# Patient Record
Sex: Male | Born: 1985 | Hispanic: No | Marital: Married | Smoking: Never smoker
Health system: Southern US, Community
[De-identification: ages and names within clinical notes are randomized; demographics above are authoritative.]

## PROBLEM LIST (undated history)

## (undated) DIAGNOSIS — Z8619 Personal history of other infectious and parasitic diseases: Secondary | ICD-10-CM

## (undated) HISTORY — PX: FINGER FRACTURE SURGERY: SHX638

## (undated) HISTORY — DX: Personal history of other infectious and parasitic diseases: Z86.19

---

## 2010-04-10 ENCOUNTER — Inpatient Hospital Stay (HOSPITAL_COMMUNITY): Admission: EM | Admit: 2010-04-10 | Discharge: 2010-04-15 | Payer: Self-pay | Source: Home / Self Care

## 2010-06-18 ENCOUNTER — Ambulatory Visit: Payer: Commercial Managed Care - PPO | Attending: Orthopedic Surgery | Admitting: Physical Therapy

## 2010-06-18 DIAGNOSIS — M25619 Stiffness of unspecified shoulder, not elsewhere classified: Secondary | ICD-10-CM | POA: Insufficient documentation

## 2010-06-18 DIAGNOSIS — IMO0001 Reserved for inherently not codable concepts without codable children: Secondary | ICD-10-CM | POA: Insufficient documentation

## 2010-06-18 DIAGNOSIS — M25519 Pain in unspecified shoulder: Secondary | ICD-10-CM | POA: Insufficient documentation

## 2010-06-27 ENCOUNTER — Ambulatory Visit: Payer: Commercial Managed Care - PPO | Attending: Orthopedic Surgery | Admitting: Physical Therapy

## 2010-06-27 DIAGNOSIS — IMO0001 Reserved for inherently not codable concepts without codable children: Secondary | ICD-10-CM | POA: Insufficient documentation

## 2010-06-27 DIAGNOSIS — M25519 Pain in unspecified shoulder: Secondary | ICD-10-CM | POA: Insufficient documentation

## 2010-06-27 DIAGNOSIS — M25619 Stiffness of unspecified shoulder, not elsewhere classified: Secondary | ICD-10-CM | POA: Insufficient documentation

## 2010-06-30 LAB — DIFFERENTIAL
Eosinophils Absolute: 0 10*3/uL (ref 0.0–0.7)
Eosinophils Relative: 0 % (ref 0–5)
Lymphocytes Relative: 9 % — ABNORMAL LOW (ref 12–46)
Lymphs Abs: 2.9 10*3/uL (ref 0.7–4.0)
Monocytes Absolute: 1.9 10*3/uL — ABNORMAL HIGH (ref 0.1–1.0)
Neutro Abs: 27.3 10*3/uL — ABNORMAL HIGH (ref 1.7–7.7)

## 2010-06-30 LAB — CBC
HCT: 41 % (ref 39.0–52.0)
Hemoglobin: 14.3 g/dL (ref 13.0–17.0)
MCH: 29.6 pg (ref 26.0–34.0)
MCHC: 33.1 g/dL (ref 30.0–36.0)
MCV: 89.6 fL (ref 78.0–100.0)
Platelets: 202 10*3/uL (ref 150–400)
Platelets: 228 10*3/uL (ref 150–400)
RBC: 4.54 MIL/uL (ref 4.22–5.81)
RBC: 4.76 MIL/uL (ref 4.22–5.81)
RDW: 13.1 % (ref 11.5–15.5)
RDW: 13.4 % (ref 11.5–15.5)
WBC: 20.6 10*3/uL — ABNORMAL HIGH (ref 4.0–10.5)
WBC: 32.1 10*3/uL — ABNORMAL HIGH (ref 4.0–10.5)

## 2010-06-30 LAB — MRSA PCR SCREENING: MRSA by PCR: NEGATIVE

## 2010-06-30 LAB — LIPASE, BLOOD: Lipase: 17 U/L (ref 11–59)

## 2010-06-30 LAB — BASIC METABOLIC PANEL
BUN: 7 mg/dL (ref 6–23)
CO2: 23 mEq/L (ref 19–32)
Calcium: 8.6 mg/dL (ref 8.4–10.5)
Chloride: 110 mEq/L (ref 96–112)
Creatinine, Ser: 1.09 mg/dL (ref 0.4–1.5)
GFR calc Af Amer: >60 mL/min (ref >60)
GFR calc non Af Amer: >60 mL/min (ref >60)
Glucose, Bld: 100 mg/dL — ABNORMAL HIGH (ref 70–99)
Potassium: 4.3 mEq/L (ref 3.5–5.1)

## 2010-06-30 LAB — URINALYSIS, ROUTINE W REFLEX MICROSCOPIC
Glucose, UA: NEGATIVE mg/dL
Ketones, ur: NEGATIVE mg/dL
Protein, ur: NEGATIVE mg/dL
Urobilinogen, UA: 0.2 mg/dL (ref 0.0–1.0)

## 2010-06-30 LAB — COMPREHENSIVE METABOLIC PANEL
Albumin: 3 g/dL — ABNORMAL LOW (ref 3.5–5.2)
CO2: 24 mEq/L (ref 19–32)
Chloride: 106 mEq/L (ref 96–112)
GFR calc non Af Amer: >60 mL/min (ref >60)
Glucose, Bld: 120 mg/dL — ABNORMAL HIGH (ref 70–99)
Potassium: 4.3 mEq/L (ref 3.5–5.1)
Sodium: 137 mEq/L (ref 135–145)
Total Bilirubin: 1.1 mg/dL (ref 0.3–1.2)

## 2010-06-30 LAB — AMYLASE: Amylase: 37 U/L (ref 0–105)
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
## 2016-03-10 DIAGNOSIS — H5213 Myopia, bilateral: Secondary | ICD-10-CM | POA: Diagnosis not present

## 2016-03-10 DIAGNOSIS — H52223 Regular astigmatism, bilateral: Secondary | ICD-10-CM | POA: Diagnosis not present

## 2017-09-21 ENCOUNTER — Encounter: Payer: Self-pay | Admitting: Physician Assistant

## 2017-09-21 ENCOUNTER — Ambulatory Visit: Payer: 59 | Admitting: Physician Assistant

## 2017-09-21 ENCOUNTER — Other Ambulatory Visit: Payer: Self-pay

## 2017-09-21 VITALS — BP 102/86 | HR 84 | Temp 98.2°F | Ht 74.8 in | Wt 227.6 lb

## 2017-09-21 DIAGNOSIS — Z13220 Encounter for screening for lipoid disorders: Secondary | ICD-10-CM | POA: Diagnosis not present

## 2017-09-21 DIAGNOSIS — Z1329 Encounter for screening for other suspected endocrine disorder: Secondary | ICD-10-CM | POA: Diagnosis not present

## 2017-09-21 DIAGNOSIS — Z7689 Persons encountering health services in other specified circumstances: Secondary | ICD-10-CM | POA: Diagnosis not present

## 2017-09-21 DIAGNOSIS — Z1389 Encounter for screening for other disorder: Secondary | ICD-10-CM

## 2017-09-21 DIAGNOSIS — Z131 Encounter for screening for diabetes mellitus: Secondary | ICD-10-CM

## 2017-09-21 DIAGNOSIS — Z130 Encounter for screening for diseases of the blood and blood-forming organs and certain disorders involving the immune mechanism: Secondary | ICD-10-CM

## 2017-09-21 DIAGNOSIS — Z13228 Encounter for screening for other metabolic disorders: Secondary | ICD-10-CM | POA: Diagnosis not present

## 2017-09-21 DIAGNOSIS — Z0000 Encounter for general adult medical examination without abnormal findings: Secondary | ICD-10-CM

## 2017-09-21 LAB — POCT URINALYSIS DIP (MANUAL ENTRY)
BILIRUBIN UA: NEGATIVE
BILIRUBIN UA: NEGATIVE mg/dL
Blood, UA: NEGATIVE
Glucose, UA: NEGATIVE mg/dL
Leukocytes, UA: NEGATIVE
Nitrite, UA: NEGATIVE
Protein Ur, POC: NEGATIVE mg/dL
SPEC GRAV UA: 1.015 (ref 1.010–1.025)
Urobilinogen, UA: 0.2 E.U./dL
pH, UA: 7.5 (ref 5.0–8.0)

## 2017-09-21 NOTE — Progress Notes (Signed)
Gill Delrossi  MRN: [id number] DOB: 16-Apr-1986  Subjective:  Pt is a 32 y.o. male who presents for annual physical exam. Pt did eat some fruit this morning a few hours ago.   Diet: Eats home cooked meals mostly, will eat out once a week. Will typically make crock pot meals. Wife is vegetarian so he ends up eating a lot of veggie meals. Will eat lean meats, fish, grilled chicken. No fast food. No soda. Drinks a lot of coffee and water. No multivitamin. Exercise: Goes in waves. Avg is 2-3 x a week. Will run at [facility]. No weights.  BM: Daily Sleep: 7-8 hours a night  Last dental exam: 3 years ago, brushes twice daily.  Last vision exam: Yearly, wears Rx contacts  Vaccinations      Tetanus: Thinks he had it within the past 10 years.         There are no active problems to display for this patient.   No current outpatient medications on file prior to visit.   No current facility-administered medications on file prior to visit.     Not on File  Social History   Socioeconomic History  . Marital status: Married    Spouse name: Not on file  . Number of children: Not on file  . Years of education: Not on file  . Highest education level: Not on file  Occupational History  . Not on file  Social Needs  . Financial resource strain: Not on file  . Food insecurity:    Worry: Not on file    Inability: Not on file  . Transportation needs:    Medical: Not on file    Non-medical: Not on file  Tobacco Use  . Smoking status: Never Smoker  . Smokeless tobacco: Never Used  Substance and Sexual Activity  . Alcohol use: Yes    Frequency: Never  . Drug use: Never  . Sexual activity: Yes  Lifestyle  . Physical activity:    Days per week: Not on file    Minutes per session: Not on file  . Stress: Not on file  Relationships  . Social connections:    Talks on phone: Not on file    Gets together: Not on file    Attends religious service: Not on file    Active member of club  or organization: Not on file    Attends meetings of clubs or organizations: Not on file    Relationship status: Not on file  Other Topics Concern  . Not on file  Social History Narrative  . Not on file    History reviewed. No pertinent surgical history.  Family History  Problem Relation Age of Onset  . Cancer Mother   . Hypertension Mother   . Hyperlipidemia Maternal Grandmother   . Hypertension Maternal Grandmother   . Hypertension Maternal Grandfather     Review of Systems  Constitutional: Negative for activity change, appetite change, chills, diaphoresis, fatigue, fever and unexpected weight change.  HENT: Negative for congestion, dental problem, drooling, ear discharge, ear pain, facial swelling, hearing loss, mouth sores, nosebleeds, postnasal drip, rhinorrhea, sinus pressure, sinus pain, sneezing, sore throat, tinnitus, trouble swallowing and voice change.   Eyes: Negative for photophobia, pain, discharge, redness, itching and visual disturbance.  Respiratory: Negative for apnea, cough, choking, chest tightness, shortness of breath, wheezing and stridor.   Cardiovascular: Negative for chest pain, palpitations and leg swelling.  Gastrointestinal: Negative for abdominal distention, abdominal pain, anal bleeding, blood  in stool, constipation, diarrhea, nausea, rectal pain and vomiting.  Endocrine: Negative for cold intolerance, heat intolerance, polydipsia, polyphagia and polyuria.  Genitourinary: Negative for decreased urine volume, difficulty urinating, discharge, dysuria, enuresis, flank pain, frequency, genital sores, hematuria, penile pain, penile swelling, scrotal swelling, testicular pain and urgency.  Musculoskeletal: Negative for arthralgias, back pain, gait problem, joint swelling, myalgias, neck pain and neck stiffness.  Skin: Negative for color change, pallor, rash and wound.  Allergic/Immunologic: Negative for environmental allergies, food allergies and  immunocompromised state.  Neurological: Negative for dizziness, tremors, seizures, syncope, facial asymmetry, speech difficulty, weakness, light-headedness, numbness and headaches.  Hematological: Negative for adenopathy. Does not bruise/bleed easily.  Psychiatric/Behavioral: Negative for agitation, behavioral problems, confusion, decreased concentration, dysphoric mood, hallucinations, self-injury, sleep disturbance and suicidal ideas. The patient is not nervous/anxious and is not hyperactive.     Objective:  BP 102/86 (BP Location: Left Arm, Patient Position: Sitting, Cuff Size: Large)   Pulse 84   Temp 98.2 F (36.8 C) (Oral)   Ht 6' 2.8" (1.9 m)   Wt 227 lb 9.6 oz (103.2 kg)   SpO2 97%   BMI 28.60 kg/m   Physical Exam  Constitutional: He is oriented to person, place, and time. He appears well-developed and well-nourished. No distress.  HENT:  Head: Normocephalic and atraumatic.  Right Ear: Hearing, tympanic membrane, external ear and ear canal normal.  Left Ear: Hearing, tympanic membrane, external ear and ear canal normal.  Nose: Nose normal.  Mouth/Throat: Uvula is midline, oropharynx is clear and moist and mucous membranes are normal. No oropharyngeal exudate.  Eyes: Pupils are equal, round, and reactive to light. Conjunctivae and EOM are normal.  Neck: Trachea normal and normal range of motion.  Cardiovascular: Normal rate, regular rhythm, normal heart sounds and intact distal pulses.  Pulmonary/Chest: Effort normal and breath sounds normal.  Abdominal: Soft. Normal appearance and bowel sounds are normal.  Musculoskeletal: Normal range of motion.  Lymphadenopathy:       Head (right side): No submental, no submandibular, no tonsillar, no preauricular, no posterior auricular and no occipital adenopathy present.       Head (left side): No submental, no submandibular, no tonsillar, no preauricular, no posterior auricular and no occipital adenopathy present.    He has no cervical  adenopathy.       Right: No supraclavicular adenopathy present.       Left: No supraclavicular adenopathy present.  Neurological: He is alert and oriented to person, place, and time. He has normal strength and normal reflexes.  Skin: Skin is warm and dry.  Vitals reviewed.  No exam data present  Assessment and Plan :  Discussed healthy lifestyle, diet, exercise, preventative care, vaccinations, and addressed patient's concerns. Plan for follow up in one year. Otherwise, plan for specific conditions below.  1. Annual physical exam Await lab results. Rec he bring immunization record to next visit.   2. Establishing care with new doctor  3. Screening, anemia, deficiency, iron - CBC with Differential/Platelet  4. Screening for metabolic disorder - [id number]  5. Screening cholesterol level - Lipid panel  6. Screening for thyroid disorder - TSH  7. Screening for diabetes mellitus - Hemoglobin A1c  8. Screening for hematuria or proteinuria - POCT urinalysis dipstick  Tenna Delaine, PA-C  [facility] 09/21/2017 10:30 AM

## 2017-09-21 NOTE — Patient Instructions (Addendum)
Health Maintenance, Male A healthy lifestyle and preventive care is important for your health and wellness. Ask your health care provider about what schedule of regular examinations is right for you. What should I know about weight and diet? Eat a Healthy Diet  Eat plenty of vegetables, fruits, whole grains, low-fat dairy products, and lean protein.  Do not eat a lot of foods high in solid fats, added sugars, or salt.  Maintain a Healthy Weight Regular exercise can help you achieve or maintain a healthy weight. You should:  Do at least 150 minutes of exercise each week. The exercise should increase your heart rate and make you sweat (moderate-intensity exercise).  Do strength-training exercises at least twice a week.  Watch Your Levels of Cholesterol and Blood Lipids  Have your blood tested for lipids and cholesterol every 5 years starting at 35 years of age. If you are at high risk for heart disease, you should start having your blood tested when you are 34 years old. You may need to have your cholesterol levels checked more often if: ? Your lipid or cholesterol levels are high. ? You are older than 34 years of age. ? You are at high risk for heart disease.  What should I know about cancer screening? Many types of cancers can be detected early and may often be prevented. Lung Cancer  You should be screened every year for lung cancer if: ? You are a current smoker who has smoked for at least 30 years. ? You are a former smoker who has quit within the past 15 years.  Talk to your health care provider about your screening options, when you should start screening, and how often you should be screened.  Colorectal Cancer  Routine colorectal cancer screening usually begins at 34 years of age and should be repeated every 5-10 years until you are 34 years old. You may need to be screened more often if early forms of precancerous polyps or small growths are found. Your health care provider  may recommend screening at an earlier age if you have risk factors for colon cancer.  Your health care provider may recommend using home test kits to check for hidden blood in the stool.  A small camera at the end of a tube can be used to examine your colon (sigmoidoscopy or colonoscopy). This checks for the earliest forms of colorectal cancer.  Prostate and Testicular Cancer  Depending on your age and overall health, your health care provider may do certain tests to screen for prostate and testicular cancer.  Talk to your health care provider about any symptoms or concerns you have about testicular or prostate cancer.  Skin Cancer  Check your skin from head to toe regularly.  Tell your health care provider about any new moles or changes in moles, especially if: ? There is a change in a mole's size, shape, or color. ? You have a mole that is larger than a pencil eraser.  Always use sunscreen. Apply sunscreen liberally and repeat throughout the day.  Protect yourself by wearing long sleeves, pants, a wide-brimmed hat, and sunglasses when outside.  What should I know about heart disease, diabetes, and high blood pressure?  If you are 18-39 years of age, have your blood pressure checked every 3-5 years. If you are 40 years of age or older, have your blood pressure checked every year. You should have your blood pressure measured twice-once when you are at a hospital or clinic, and once when   you are not at a hospital or clinic. Record the average of the two measurements. To check your blood pressure when you are not at a hospital or clinic, you can use: ? An automated blood pressure machine at a pharmacy. ? A home blood pressure monitor.  Talk to your health care provider about your target blood pressure.  If you are between 45-79 years old, ask your health care provider if you should take aspirin to prevent heart disease.  Have regular diabetes screenings by checking your fasting blood  sugar level. ? If you are at a normal weight and have a low risk for diabetes, have this test once every three years after the age of 45. ? If you are overweight and have a high risk for diabetes, consider being tested at a younger age or more often.  A one-time screening for abdominal aortic aneurysm (AAA) by ultrasound is recommended for men aged 65-75 years who are current or former smokers. What should I know about preventing infection? Hepatitis B If you have a higher risk for hepatitis B, you should be screened for this virus. Talk with your health care provider to find out if you are at risk for hepatitis B infection. Hepatitis C Blood testing is recommended for:  Everyone born from 1945 through 1965.  Anyone with known risk factors for hepatitis C.  Sexually Transmitted Diseases (STDs)  You should be screened each year for STDs including gonorrhea and chlamydia if: ? You are sexually active and are younger than 34 years of age. ? You are older than 34 years of age and your health care provider tells you that you are at risk for this type of infection. ? Your sexual activity has changed since you were last screened and you are at an increased risk for chlamydia or gonorrhea. Ask your health care provider if you are at risk.  Talk with your health care provider about whether you are at high risk of being infected with HIV. Your health care provider may recommend a prescription medicine to help prevent HIV infection.  What else can I do?  Schedule regular health, dental, and eye exams.  Stay current with your vaccines (immunizations).  Do not use any tobacco products, such as cigarettes, chewing tobacco, and e-cigarettes. If you need help quitting, ask your health care provider.  Limit alcohol intake to no more than 2 drinks per day. One drink equals 12 ounces of beer, 5 ounces of wine, or 1 ounces of hard liquor.  Do not use street drugs.  Do not share needles.  Ask your  health care provider for help if you need support or information about quitting drugs.  Tell your health care provider if you often feel depressed.  Tell your health care provider if you have ever been abused or do not feel safe at home. This information is not intended to replace advice given to you by your health care provider. Make sure you discuss any questions you have with your health care provider. Document Released: 10/03/2007 Document Revised: 12/04/2015 Document Reviewed: 01/08/2015 Elsevier Interactive Patient Education  2018 [employer]     IF you received an x-ray today, you will receive an invoice from [facility]. Please contact [facility] at [phone] with questions or concerns regarding your invoice.   IF you received labwork today, you will receive an invoice from [facility]. Please contact [facility] at [phone] with questions or concerns regarding your invoice.   Our billing staff will not be   able to assist you with questions regarding bills from these companies.  You will be contacted with the lab results as soon as they are available. The fastest way to get your results is to activate your My Chart account. Instructions are located on the last page of this paperwork. If you have not heard from us regarding the results in 2 weeks, please contact this office.       

## 2017-09-22 ENCOUNTER — Encounter: Payer: Self-pay | Admitting: *Deleted

## 2017-09-22 LAB — CBC WITH DIFFERENTIAL/PLATELET
BASOS: 1 %
Basophils Absolute: 0.1 10*3/uL (ref 0.0–0.2)
EOS (ABSOLUTE): 0.1 10*3/uL (ref 0.0–0.4)
EOS: 2 %
HEMATOCRIT: 45.1 % (ref 37.5–51.0)
HEMOGLOBIN: 15.6 g/dL (ref 13.0–17.7)
IMMATURE GRANS (ABS): 0 10*3/uL (ref 0.0–0.1)
Immature Granulocytes: 0 %
LYMPHS ABS: 2.3 10*3/uL (ref 0.7–3.1)
Lymphs: 39 %
MCH: 29.7 pg (ref 26.6–33.0)
MCHC: 34.6 g/dL (ref 31.5–35.7)
MCV: 86 fL (ref 79–97)
MONOCYTES: 11 %
Monocytes Absolute: 0.6 10*3/uL (ref 0.1–0.9)
NEUTROS ABS: 2.8 10*3/uL (ref 1.4–7.0)
Neutrophils: 47 %
Platelets: 299 10*3/uL (ref 150–450)
RBC: 5.25 x10E6/uL (ref 4.14–5.80)
RDW: 13.5 % (ref 12.3–15.4)
WBC: 5.9 10*3/uL (ref 3.4–10.8)

## 2017-09-22 LAB — CMP14+EGFR
A/G RATIO: 1.7 (ref 1.2–2.2)
ALK PHOS: 67 IU/L (ref 39–117)
ALT: 49 IU/L — ABNORMAL HIGH (ref 0–44)
AST: 35 IU/L (ref 0–40)
Albumin: 4.5 g/dL (ref 3.5–5.5)
BILIRUBIN TOTAL: 1.1 mg/dL (ref 0.0–1.2)
BUN / CREAT RATIO: 12 (ref 9–20)
BUN: 13 mg/dL (ref 6–20)
CHLORIDE: 99 mmol/L (ref 96–106)
CO2: 25 mmol/L (ref 20–29)
Calcium: 9.8 mg/dL (ref 8.7–10.2)
Creatinine, Ser: 1.1 mg/dL (ref 0.76–1.27)
GFR calc non Af Amer: 87 mL/min/{1.73_m2} (ref >59)
GFR, EST AFRICAN AMERICAN: 101 mL/min/{1.73_m2} (ref >59)
GLOBULIN, TOTAL: 2.6 g/dL (ref 1.5–4.5)
Glucose: 91 mg/dL (ref 65–99)
Potassium: 5.2 mmol/L (ref 3.5–5.2)
SODIUM: 138 mmol/L (ref 134–144)
TOTAL PROTEIN: 7.1 g/dL (ref 6.0–8.5)

## 2017-09-22 LAB — LIPID PANEL
CHOLESTEROL TOTAL: 196 mg/dL (ref 100–199)
Chol/HDL Ratio: 2.8 ratio (ref 0.0–5.0)
HDL: 71 mg/dL (ref >39)
LDL Calculated: 109 mg/dL — ABNORMAL HIGH (ref 0–99)
Triglycerides: 82 mg/dL (ref 0–149)
VLDL Cholesterol Cal: 16 mg/dL (ref 5–40)

## 2017-09-22 LAB — TSH: TSH: 1.72 u[IU]/mL (ref 0.450–4.500)

## 2017-09-22 LAB — HEMOGLOBIN A1C
ESTIMATED AVERAGE GLUCOSE: 103 mg/dL
HEMOGLOBIN A1C: 5.2 % (ref 4.8–5.6)

## 2019-07-17 ENCOUNTER — Other Ambulatory Visit: Payer: Self-pay

## 2019-07-17 ENCOUNTER — Encounter: Payer: Self-pay | Admitting: Physician Assistant

## 2019-07-17 ENCOUNTER — Ambulatory Visit (INDEPENDENT_AMBULATORY_CARE_PROVIDER_SITE_OTHER): Payer: No Typology Code available for payment source | Admitting: Physician Assistant

## 2019-07-17 VITALS — BP 100/70 | HR 79 | Temp 99.1°F | Resp 16 | Ht 72.25 in | Wt 214.0 lb

## 2019-07-17 DIAGNOSIS — Z0000 Encounter for general adult medical examination without abnormal findings: Secondary | ICD-10-CM | POA: Diagnosis not present

## 2019-07-17 LAB — LIPID PANEL
Cholesterol: 172 mg/dL (ref 0–200)
HDL: 56.5 mg/dL (ref >39.00)
LDL Cholesterol: 97 mg/dL (ref 0–99)
NonHDL: 115.49
Total CHOL/HDL Ratio: 3
Triglycerides: 93 mg/dL (ref 0.0–149.0)
VLDL: 18.6 mg/dL (ref 0.0–40.0)

## 2019-07-17 LAB — CBC WITH DIFFERENTIAL/PLATELET
Basophils Absolute: 0.1 10*3/uL (ref 0.0–0.1)
Basophils Relative: 0.9 % (ref 0.0–3.0)
Eosinophils Absolute: 0.2 10*3/uL (ref 0.0–0.7)
Eosinophils Relative: 3 % (ref 0.0–5.0)
HCT: 44.8 % (ref 39.0–52.0)
Hemoglobin: 15.4 g/dL (ref 13.0–17.0)
Lymphocytes Relative: 35.2 % (ref 12.0–46.0)
Lymphs Abs: 2.1 10*3/uL (ref 0.7–4.0)
MCHC: 34.3 g/dL (ref 30.0–36.0)
MCV: 90.3 fl (ref 78.0–100.0)
Monocytes Absolute: 0.5 10*3/uL (ref 0.1–1.0)
Monocytes Relative: 8.3 % (ref 3.0–12.0)
Neutro Abs: 3.1 10*3/uL (ref 1.4–7.7)
Neutrophils Relative %: 52.6 % (ref 43.0–77.0)
Platelets: 282 10*3/uL (ref 150.0–400.0)
RBC: 4.96 Mil/uL (ref 4.22–5.81)
RDW: 13.9 % (ref 11.5–15.5)
WBC: 5.9 10*3/uL (ref 4.0–10.5)

## 2019-07-17 LAB — COMPREHENSIVE METABOLIC PANEL
ALT: 33 U/L (ref 0–53)
AST: 27 U/L (ref 0–37)
Albumin: 4.3 g/dL (ref 3.5–5.2)
Alkaline Phosphatase: 55 U/L (ref 39–117)
BUN: 14 mg/dL (ref 6–23)
CO2: 29 mEq/L (ref 19–32)
Calcium: 9.5 mg/dL (ref 8.4–10.5)
Chloride: 104 mEq/L (ref 96–112)
Creatinine, Ser: 0.93 mg/dL (ref 0.40–1.50)
GFR: 91.94 mL/min (ref >60.00)
Glucose, Bld: 85 mg/dL (ref 70–99)
Potassium: 4.8 mEq/L (ref 3.5–5.1)
Sodium: 139 mEq/L (ref 135–145)
Total Bilirubin: 1.3 mg/dL — ABNORMAL HIGH (ref 0.2–1.2)
Total Protein: 6.9 g/dL (ref 6.0–8.3)

## 2019-07-17 NOTE — Progress Notes (Signed)
Patient presents to clinic today to establish care and for annual exam.  Patient is fasting for labs.  Diet -- Mostly plant-based diet (wife is vegetarian).   Exercise -- Was running about 5-6 days per week at the park prior to [place]. Has still been keeping up with this but not as much.   Acute Concerns: Denies acute concerns today.   Health Maintenance: Immunizations -- Flu shot up-to-date. Tetanus UTD per patient. Will get records from employee health.   Past Medical History:  Diagnosis Date  . History of chickenpox     Past Surgical History:  Procedure Laterality Date  . FINGER FRACTURE SURGERY     Pinky    No current outpatient medications on file prior to visit.   No current facility-administered medications on file prior to visit.    Allergies  Allergen Reactions  . Tuberculin Tests Rash    To ppd skin test    Family History  Problem Relation Age of Onset  . Hypertension Mother   . Breast cancer Mother   . Healthy Father   . Healthy Sister   . Hyperlipidemia Maternal Grandmother   . Hypertension Maternal Grandmother   . Hypertension Maternal Grandfather     Social History   Socioeconomic History  . Marital status: Married    Spouse name: Caryl Pina   . Number of children: 0  . Years of education: Not on file  . Highest education level: Doctorate  Occupational History  . Occupation: Marine scientist: [employer]  Tobacco Use  . Smoking status: Never Smoker  . Smokeless tobacco: Never Used  Substance and Sexual Activity  . Alcohol use: Yes    Comment: socially 1-2 x per week  . Drug use: Never  . Sexual activity: Yes    Partners: Female    Birth control/protection: None    Comment: with monagamous partner  Other Topics Concern  . Not on file  Social History Narrative   Pt was born in [place]. Moved here in 1994. Currently lives with his wife. They have a cat. They are currently trying to have children.    Social Determinants  of Health   Financial Resource Strain:   . Difficulty of Paying Living Expenses:   Food Insecurity:   . Worried About Charity fundraiser in the Last Year:   . Arboriculturist in the Last Year:   Transportation Needs:   . Film/video editor (Medical):   Marland Kitchen Lack of Transportation (Non-Medical):   Physical Activity:   . Days of Exercise per Week:   . Minutes of Exercise per Session:   Stress:   . Feeling of Stress :   Social Connections:   . Frequency of Communication with Friends and Family:   . Frequency of Social Gatherings with Friends and Family:   . Attends Religious Services:   . Active Member of Clubs or Organizations:   . Attends Archivist Meetings:   Marland Kitchen Marital Status:   Intimate Partner Violence:   . Fear of Current or Ex-Partner:   . Emotionally Abused:   Marland Kitchen Physically Abused:   . Sexually Abused:    Review of Systems  Constitutional: Negative for fever and weight loss.  HENT: Negative for ear discharge, ear pain, hearing loss and tinnitus.   Eyes: Negative for blurred vision, double vision, photophobia and pain.  Respiratory: Negative for cough and shortness of breath.   Cardiovascular: Negative for chest pain  and palpitations.  Gastrointestinal: Negative for abdominal pain, blood in stool, constipation, diarrhea, heartburn, melena, nausea and vomiting.  Genitourinary: Negative for dysuria, flank pain, frequency, hematuria and urgency.  Musculoskeletal: Negative for falls.  Neurological: Negative for dizziness, loss of consciousness and headaches.  Endo/Heme/Allergies: Negative for environmental allergies.  Psychiatric/Behavioral: Negative for depression, hallucinations, substance abuse and suicidal ideas. The patient is not nervous/anxious and does not have insomnia.    BP 100/70   Pulse 79   Temp 99.1 F (37.3 C) (Temporal)   Resp 16   Ht 6' 0.25" (1.835 m)   Wt 214 lb (97.1 kg)   SpO2 98%   BMI 28.82 kg/m   Physical Exam Vitals reviewed.    Constitutional:      General: He is not in acute distress.    Appearance: He is well-developed. He is not diaphoretic.  HENT:     Head: Normocephalic and atraumatic.     Right Ear: Tympanic membrane, ear canal and external ear normal.     Left Ear: Tympanic membrane, ear canal and external ear normal.     Nose: Nose normal.     Mouth/Throat:     Pharynx: No posterior oropharyngeal erythema.  Eyes:     Conjunctiva/sclera: Conjunctivae normal.     Pupils: Pupils are equal, round, and reactive to light.  Neck:     Thyroid: No thyromegaly.  Cardiovascular:     Rate and Rhythm: Normal rate and regular rhythm.     Heart sounds: Normal heart sounds.  Pulmonary:     Effort: Pulmonary effort is normal. No respiratory distress.     Breath sounds: Normal breath sounds. No wheezing or rales.  Chest:     Chest wall: No tenderness.  Abdominal:     General: Bowel sounds are normal. There is no distension.     Palpations: Abdomen is soft. There is no mass.     Tenderness: There is no abdominal tenderness. There is no guarding or rebound.  Musculoskeletal:     Cervical back: Neck supple.  Lymphadenopathy:     Cervical: No cervical adenopathy.  Skin:    General: Skin is warm and dry.     Findings: No rash.  Neurological:     Mental Status: He is alert and oriented to person, place, and time.     Cranial Nerves: No cranial nerve deficit.    Assessment/Plan: Visit for preventive health examination Depression screen negative. Health Maintenance reviewed -- flu vaccine UTD. Will get records of Tetanus. Preventive schedule discussed and handout given in AVS. Will obtain fasting labs today.   This visit occurred during the SARS-CoV-2 public health emergency.  Safety protocols were in place, including screening questions prior to the visit, additional usage of staff PPE, and extensive cleaning of exam room while observing appropriate contact time as indicated for disinfecting solutions.      Piedad Climes, PA-C

## 2019-07-17 NOTE — Patient Instructions (Signed)
Please go to the lab for blood work.   Our office will call you with your results unless you have chosen to receive results via MyChart.  If your blood work is normal we will follow-up each year for physicals and as scheduled for chronic medical problems.  If anything is abnormal we will treat accordingly and get you in for a follow-up.   Preventive Care 36-36 Years Old, Male Preventive care refers to lifestyle choices and visits with your health care provider that can promote health and wellness. This includes:  A yearly physical exam. This is also called an annual well check.  Regular dental and eye exams.  Immunizations.  Screening for certain conditions.  Healthy lifestyle choices, such as eating a healthy diet, getting regular exercise, not using drugs or products that contain nicotine and tobacco, and limiting alcohol use. What can I expect for my preventive care visit? Physical exam Your health care provider will check:  Height and weight. These may be used to calculate body mass index (BMI), which is a measurement that tells if you are at a healthy weight.  Heart rate and blood pressure.  Your skin for abnormal spots. Counseling Your health care provider may ask you questions about:  Alcohol, tobacco, and drug use.  Emotional well-being.  Home and relationship well-being.  Sexual activity.  Eating habits.  Work and work Statistician. What immunizations do I need?  Influenza (flu) vaccine  This is recommended every year. Tetanus, diphtheria, and pertussis (Tdap) vaccine  You may need a Td booster every 10 years. Varicella (chickenpox) vaccine  You may need this vaccine if you have not already been vaccinated. Human papillomavirus (HPV) vaccine  If recommended by your health care provider, you may need three doses over 6 months. Measles, mumps, and rubella (MMR) vaccine  You may need at least one dose of MMR. You may also need a second  dose. Meningococcal conjugate (MenACWY) vaccine  One dose is recommended if you are 36-36 years old and a Market researcher living in a residence hall, or if you have one of several medical conditions. You may also need additional booster doses. Pneumococcal conjugate (PCV13) vaccine  You may need this if you have certain conditions and were not previously vaccinated. Pneumococcal polysaccharide (PPSV23) vaccine  You may need one or two doses if you smoke cigarettes or if you have certain conditions. Hepatitis A vaccine  You may need this if you have certain conditions or if you travel or work in places where you may be exposed to hepatitis A. Hepatitis B vaccine  You may need this if you have certain conditions or if you travel or work in places where you may be exposed to hepatitis B. Haemophilus influenzae type b (Hib) vaccine  You may need this if you have certain risk factors. You may receive vaccines as individual doses or as more than one vaccine together in one shot (combination vaccines). Talk with your health care provider about the risks and benefits of combination vaccines. What tests do I need? Blood tests  Lipid and cholesterol levels. These may be checked every 5 years starting at age 36.  Hepatitis C test.  Hepatitis B test. Screening   Diabetes screening. This is done by checking your blood sugar (glucose) after you have not eaten for a while (fasting).  Sexually transmitted disease (STD) testing. Talk with your health care provider about your test results, treatment options, and if necessary, the need for more tests. Follow these  instructions at home: Eating and drinking   Eat a diet that includes fresh fruits and vegetables, whole grains, lean protein, and low-fat dairy products.  Take vitamin and mineral supplements as recommended by your health care provider.  Do not drink alcohol if your health care provider tells you not to drink.  If you  drink alcohol: ? Limit how much you have to 0-2 drinks a day. ? Be aware of how much alcohol is in your drink. In the [place] one drink equals one 12 oz bottle of beer (355 mL), one 5 oz glass of wine (148 mL), or one 1 oz glass of hard liquor (44 mL). Lifestyle  Take daily care of your teeth and gums.  Stay active. Exercise for at least 30 minutes on 5 or more days each week.  Do not use any products that contain nicotine or tobacco, such as cigarettes, e-cigarettes, and chewing tobacco. If you need help quitting, ask your health care provider.  If you are sexually active, practice safe sex. Use a condom or other form of protection to prevent STIs (sexually transmitted infections). What's next?  Go to your health care provider once a year for a well check visit.  Ask your health care provider how often you should have your eyes and teeth checked.  Stay up to date on all vaccines. This information is not intended to replace advice given to you by your health care provider. Make sure you discuss any questions you have with your health care provider. Document Revised: 03/31/2018 Document Reviewed: 03/31/2018 Elsevier Patient Education  [employer]. .

## 2019-07-17 NOTE — Assessment & Plan Note (Signed)
Depression screen negative. Health Maintenance reviewed -- flu vaccine UTD. Will get records of Tetanus. Preventive schedule discussed and handout given in AVS. Will obtain fasting labs today.

## 2020-07-24 ENCOUNTER — Other Ambulatory Visit (HOSPITAL_COMMUNITY): Payer: No Typology Code available for payment source

## 2020-08-29 ENCOUNTER — Telehealth: Payer: Self-pay

## 2020-08-29 NOTE — Telephone Encounter (Signed)
LVM advising patient to call back and schedule TOC. 

## 2020-10-14 ENCOUNTER — Encounter: Payer: Self-pay | Admitting: Registered Nurse

## 2020-11-11 ENCOUNTER — Ambulatory Visit: Payer: 59 | Admitting: Registered Nurse

## 2020-11-11 ENCOUNTER — Other Ambulatory Visit: Payer: Self-pay

## 2020-11-11 ENCOUNTER — Encounter: Payer: Self-pay | Admitting: Registered Nurse

## 2020-11-11 VITALS — BP 105/74 | HR 75 | Temp 98.3°F | Resp 18 | Ht 72.0 in | Wt 215.0 lb

## 2020-11-11 DIAGNOSIS — Z0000 Encounter for general adult medical examination without abnormal findings: Secondary | ICD-10-CM

## 2020-11-11 DIAGNOSIS — Z7689 Persons encountering health services in other specified circumstances: Secondary | ICD-10-CM | POA: Diagnosis not present

## 2020-11-11 DIAGNOSIS — Z13220 Encounter for screening for lipoid disorders: Secondary | ICD-10-CM

## 2020-11-11 DIAGNOSIS — Z130 Encounter for screening for diseases of the blood and blood-forming organs and certain disorders involving the immune mechanism: Secondary | ICD-10-CM | POA: Diagnosis not present

## 2020-11-11 DIAGNOSIS — Z1329 Encounter for screening for other suspected endocrine disorder: Secondary | ICD-10-CM | POA: Diagnosis not present

## 2020-11-11 DIAGNOSIS — Z1159 Encounter for screening for other viral diseases: Secondary | ICD-10-CM | POA: Diagnosis not present

## 2020-11-11 DIAGNOSIS — Z13228 Encounter for screening for other metabolic disorders: Secondary | ICD-10-CM

## 2020-11-11 NOTE — Progress Notes (Signed)
Established Patient Office Visit  Subjective:  Patient ID: Nicholas Nicholson, male    DOB: December 17, 1985  Age: 35 y.o. MRN: [id number]  CC:  Chief Complaint  Patient presents with   Transitions Of Care    Patient is here for TOC and CPE.patient has no concerns.    HPI [name] presents for TOC and CPE  No acute concerns  Histories reviewed and updated with patient.    Past Medical History:  Diagnosis Date   History of chickenpox     Past Surgical History:  Procedure Laterality Date   FINGER FRACTURE SURGERY     Pinky    Family History  Problem Relation Age of Onset   Hypertension Mother    Breast cancer Mother    Healthy Father    Healthy Sister    Hyperlipidemia Maternal Grandmother    Hypertension Maternal Grandmother    Hypertension Maternal Grandfather     Social History   Socioeconomic History   Marital status: Married    Spouse name: Morrie Sheldon    Number of children: 0   Years of education: Not on file   Highest education level: Doctorate  Occupational History   Occupation: Printmaker: [employer]  Tobacco Use   Smoking status: Never   Smokeless tobacco: Never  Vaping Use   Vaping Use: Never used  Substance and Sexual Activity   Alcohol use: Yes    Comment: socially 1-2 x per week   Drug use: Never   Sexual activity: Yes    Partners: Female    Birth control/protection: None    Comment: with monagamous partner  Other Topics Concern   Not on file  Social History Narrative   Pt was born in [place]. Moved here in 1994. Currently lives with his wife. They have a cat. They are currently trying to have children.    Social Determinants of Health   Financial Resource Strain: Not on file  Food Insecurity: Not on file  Transportation Needs: Not on file  Physical Activity: Not on file  Stress: Not on file  Social Connections: Not on file  Intimate Partner Violence: Not on file    No outpatient medications prior to visit.    No facility-administered medications prior to visit.    Allergies  Allergen Reactions   Tuberculin Tests Rash    To ppd skin test    ROS Review of Systems  Constitutional: Negative.   HENT: Negative.    Eyes: Negative.   Respiratory: Negative.    Cardiovascular: Negative.   Gastrointestinal: Negative.   Genitourinary: Negative.   Musculoskeletal: Negative.   Skin: Negative.   Neurological: Negative.   Psychiatric/Behavioral: Negative.    All other systems reviewed and are negative.    Objective:    Physical Exam Vitals and nursing note reviewed.  Constitutional:      General: He is not in acute distress.    Appearance: Normal appearance. He is normal weight. He is not ill-appearing, toxic-appearing or diaphoretic.  HENT:     Head: Normocephalic and atraumatic.     Right Ear: Tympanic membrane, ear canal and external ear normal. There is no impacted cerumen.     Left Ear: Tympanic membrane, ear canal and external ear normal. There is no impacted cerumen.     Nose: Nose normal. No congestion or rhinorrhea.     Mouth/Throat:     Mouth: Mucous membranes are moist.     Pharynx: Oropharynx is clear. No  oropharyngeal exudate or posterior oropharyngeal erythema.  Eyes:     General: No scleral icterus.       Right eye: No discharge.        Left eye: No discharge.     Extraocular Movements: Extraocular movements intact.     Conjunctiva/sclera: Conjunctivae normal.     Pupils: Pupils are equal, round, and reactive to light.  Neck:     Vascular: No carotid bruit.  Cardiovascular:     Rate and Rhythm: Normal rate and regular rhythm.     Pulses: Normal pulses.     Heart sounds: Normal heart sounds. No murmur heard.   No friction rub. No gallop.  Pulmonary:     Effort: Pulmonary effort is normal. No respiratory distress.     Breath sounds: Normal breath sounds. No stridor. No wheezing, rhonchi or rales.  Chest:     Chest wall: No tenderness.  Abdominal:     General:  Abdomen is flat. Bowel sounds are normal. There is no distension.     Palpations: Abdomen is soft. There is no mass.     Tenderness: There is no abdominal tenderness. There is no right CVA tenderness, left CVA tenderness, guarding or rebound.     Hernia: No hernia is present.  Musculoskeletal:        General: No swelling, tenderness, deformity or signs of injury. Normal range of motion.     Cervical back: Normal range of motion and neck supple. No rigidity or tenderness.     Right lower leg: No edema.     Left lower leg: No edema.  Lymphadenopathy:     Cervical: No cervical adenopathy.  Skin:    General: Skin is warm and dry.     Capillary Refill: Capillary refill takes less than 2 seconds.     Coloration: Skin is not jaundiced or pale.     Findings: No bruising, erythema, lesion or rash.  Neurological:     General: No focal deficit present.     Mental Status: He is alert and oriented to person, place, and time. Mental status is at baseline.     Cranial Nerves: No cranial nerve deficit.     Motor: No weakness.     Gait: Gait normal.  Psychiatric:        Mood and Affect: Mood normal.        Behavior: Behavior normal.        Thought Content: Thought content normal.        Judgment: Judgment normal.    BP 105/74   Pulse 75   Temp 98.3 F (36.8 C) (Temporal)   Resp 18   Ht 6' (1.829 m)   Wt 215 lb (97.5 kg)   SpO2 99%   BMI 29.16 kg/m  Wt Readings from Last 3 Encounters:  11/11/20 215 lb (97.5 kg)  07/17/19 214 lb (97.1 kg)  09/21/17 227 lb 9.6 oz (103.2 kg)     Health Maintenance Due  Topic Date Due   Hepatitis C Screening  Never done    There are no preventive care reminders to display for this patient.  Lab Results  Component Value Date   TSH 1.720 09/21/2017   Lab Results  Component Value Date   WBC 5.9 07/17/2019   HGB 15.4 07/17/2019   HCT 44.8 07/17/2019   MCV 90.3 07/17/2019   PLT 282.0 07/17/2019   Lab Results  Component Value Date   NA 139  07/17/2019   K 4.8 07/17/2019  CO2 29 07/17/2019   GLUCOSE 85 07/17/2019   BUN 14 07/17/2019   CREATININE 0.93 07/17/2019   BILITOT 1.3 (H) 07/17/2019   ALKPHOS 55 07/17/2019   AST 27 07/17/2019   ALT 33 07/17/2019   PROT 6.9 07/17/2019   ALBUMIN 4.3 07/17/2019   CALCIUM 9.5 07/17/2019   GFR 91.94 07/17/2019   Lab Results  Component Value Date   CHOL 172 07/17/2019   Lab Results  Component Value Date   HDL 56.50 07/17/2019   Lab Results  Component Value Date   LDLCALC 97 07/17/2019   Lab Results  Component Value Date   TRIG 93.0 07/17/2019   Lab Results  Component Value Date   CHOLHDL 3 07/17/2019   Lab Results  Component Value Date   HGBA1C 5.2 09/21/2017      Assessment & Plan:   Problem List Items Addressed This Visit   None Visit Diagnoses     Annual physical exam    -  Primary   Encounter to establish care       Lipid screening       Screening for endocrine, metabolic and immunity disorder       Screening for viral disease           No orders of the defined types were placed in this encounter.   Follow-up: No follow-ups on file.   PLAN Exam unremarkable Labs collected. Will follow up with the patient as warranted. Return annually Patient encouraged to call clinic with any questions, comments, or concerns.  Janeece Agee, NP

## 2020-11-11 NOTE — Patient Instructions (Signed)
Mr. Laspina -  Randie Heinz to meet you.   Exam shows no concerns  Labs collected, I'll call with any worries.  If everything is normal, I'll see you in a year to check again.  Please call sooner if you need anything.  Thank you  Rich

## 2020-11-12 LAB — COMPREHENSIVE METABOLIC PANEL
ALT: 34 U/L (ref 0–53)
AST: 27 U/L (ref 0–37)
Albumin: 4.4 g/dL (ref 3.5–5.2)
Alkaline Phosphatase: 55 U/L (ref 39–117)
BUN: 14 mg/dL (ref 6–23)
CO2: 25 mEq/L (ref 19–32)
Calcium: 9.4 mg/dL (ref 8.4–10.5)
Chloride: 104 mEq/L (ref 96–112)
Creatinine, Ser: 0.97 mg/dL (ref 0.40–1.50)
GFR: 99.87 mL/min (ref >60.00)
Glucose, Bld: 82 mg/dL (ref 70–99)
Potassium: 4.1 mEq/L (ref 3.5–5.1)
Sodium: 139 mEq/L (ref 135–145)
Total Bilirubin: 0.7 mg/dL (ref 0.2–1.2)
Total Protein: 7.2 g/dL (ref 6.0–8.3)

## 2020-11-12 LAB — LIPID PANEL
Cholesterol: 181 mg/dL (ref 0–200)
HDL: 59.7 mg/dL (ref >39.00)
LDL Cholesterol: 90 mg/dL (ref 0–99)
NonHDL: 120.92
Total CHOL/HDL Ratio: 3
Triglycerides: 153 mg/dL — ABNORMAL HIGH (ref 0.0–149.0)
VLDL: 30.6 mg/dL (ref 0.0–40.0)

## 2020-11-12 LAB — CBC WITH DIFFERENTIAL/PLATELET
Basophils Absolute: 0.1 10*3/uL (ref 0.0–0.1)
Basophils Relative: 0.8 % (ref 0.0–3.0)
Eosinophils Absolute: 0.1 10*3/uL (ref 0.0–0.7)
Eosinophils Relative: 1.8 % (ref 0.0–5.0)
HCT: 45 % (ref 39.0–52.0)
Hemoglobin: 15.2 g/dL (ref 13.0–17.0)
Lymphocytes Relative: 27.8 % (ref 12.0–46.0)
Lymphs Abs: 2.3 10*3/uL (ref 0.7–4.0)
MCHC: 33.7 g/dL (ref 30.0–36.0)
MCV: 90.1 fl (ref 78.0–100.0)
Monocytes Absolute: 0.7 10*3/uL (ref 0.1–1.0)
Monocytes Relative: 8.7 % (ref 3.0–12.0)
Neutro Abs: 5.1 10*3/uL (ref 1.4–7.7)
Neutrophils Relative %: 60.9 % (ref 43.0–77.0)
Platelets: 278 10*3/uL (ref 150.0–400.0)
RBC: 5 Mil/uL (ref 4.22–5.81)
RDW: 13.2 % (ref 11.5–15.5)
WBC: 8.3 10*3/uL (ref 4.0–10.5)

## 2020-11-12 LAB — HEPATITIS C ANTIBODY
Hepatitis C Ab: NONREACTIVE
SIGNAL TO CUT-OFF: 0.43 (ref <1.00)

## 2020-11-12 LAB — TSH: TSH: 1.75 u[IU]/mL (ref 0.35–5.50)

## 2020-11-12 LAB — HEMOGLOBIN A1C: Hgb A1c MFr Bld: 5.3 % (ref 4.6–6.5)

## 2021-11-14 ENCOUNTER — Encounter: Payer: 59 | Admitting: Registered Nurse

## 2022-03-23 ENCOUNTER — Other Ambulatory Visit (HOSPITAL_COMMUNITY): Payer: Self-pay

## 2022-03-23 MED ORDER — AMOXICILLIN 500 MG PO CAPS
500.0000 mg | ORAL_CAPSULE | Freq: Three times a day (TID) | ORAL | 1 refills | Status: DC
  Filled 2022-03-23: qty 30, 10d supply, fill #0

## 2022-03-24 ENCOUNTER — Other Ambulatory Visit (HOSPITAL_COMMUNITY): Payer: Self-pay

## 2022-03-30 ENCOUNTER — Encounter (HOSPITAL_BASED_OUTPATIENT_CLINIC_OR_DEPARTMENT_OTHER): Payer: Self-pay | Admitting: Family Medicine

## 2022-03-30 ENCOUNTER — Other Ambulatory Visit (HOSPITAL_COMMUNITY): Payer: Self-pay

## 2022-03-30 ENCOUNTER — Ambulatory Visit (INDEPENDENT_AMBULATORY_CARE_PROVIDER_SITE_OTHER): Payer: No Typology Code available for payment source | Admitting: Family Medicine

## 2022-03-30 VITALS — BP 114/82 | HR 64 | Ht 72.0 in | Wt 221.6 lb

## 2022-03-30 DIAGNOSIS — Z0000 Encounter for general adult medical examination without abnormal findings: Secondary | ICD-10-CM

## 2022-03-30 DIAGNOSIS — R21 Rash and other nonspecific skin eruption: Secondary | ICD-10-CM | POA: Diagnosis not present

## 2022-03-30 MED ORDER — TRIAMCINOLONE ACETONIDE 0.5 % EX CREA
1.0000 | TOPICAL_CREAM | Freq: Two times a day (BID) | CUTANEOUS | 3 refills | Status: DC
  Filled 2022-03-30: qty 30, 15d supply, fill #0

## 2022-03-30 NOTE — Assessment & Plan Note (Signed)
Uncertain etiology.  Did not respond to topical antifungal therapy, which given antifungal utilized, would have expected a response if rash was related to fungal etiology At this time, we will proceed with utilization of topical steroid cream, prescription sent to pharmacy Will also place referral for further evaluation with dermatology

## 2022-03-30 NOTE — Progress Notes (Signed)
New Patient Office Visit  Subjective    Patient ID: Nicholas Nicholson, male    DOB: August 06, 1985  Age: 36 y.o. MRN: [id number]  CC:  Chief Complaint  Patient presents with   New Patient (Initial Visit)    Pt here to establish new care    HPI Nicholas Nicholson presents to establish care Last PCP - Nicholas Nicholson, [facility] [facility] in [place]  Has skin changes in left axilla. First noted about 1 month ago. No itching, no pain. Noticed having a darker pigmentation to the area. Has been trying clotrimazole topically to the area, no changes noted with this. Does not feel that the appearance of the area has change since it was first observed.  Patient is originally from [place], has lived here since 1994. Patient works as a Teacher, early years/pre. Outside of work, he enjoys spending time with family and being at home.  Outpatient Encounter Medications as of 03/30/2022  Medication Sig   amoxicillin (AMOXIL) 500 MG capsule Take 1 capsule (500 mg total) by mouth 3 (three) times daily for infection   triamcinolone cream (KENALOG) 0.5 % Apply to affected areas 2 (two) times daily.   No facility-administered encounter medications on file as of 03/30/2022.    Past Medical History:  Diagnosis Date   History of chickenpox     Past Surgical History:  Procedure Laterality Date   FINGER FRACTURE SURGERY     Pinky    Family History  Problem Relation Age of Onset   Hypertension Mother    Breast cancer Mother    Healthy Father    Healthy Sister    Hyperlipidemia Maternal Grandmother    Hypertension Maternal Grandmother    Hypertension Maternal Grandfather     Social History   Socioeconomic History   Marital status: Married    Spouse name: Nicholas Nicholson    Number of children: 2   Years of education: Not on file   Highest education level: Doctorate  Occupational History   Occupation: Printmaker: [employer]  Tobacco Use   Smoking status: Never   Smokeless tobacco: Never  Vaping Use    Vaping Use: Never used  Substance and Sexual Activity   Alcohol use: Yes    Comment: socially 1-2 x per week   Drug use: Never   Sexual activity: Yes    Partners: Female    Birth control/protection: None    Comment: with monagamous partner  Other Topics Concern   Not on file  Social History Narrative   Pt was born in [place]. Moved here in 1994. Currently lives with his wife. They have a cat. They are currently trying to have children.    Social Determinants of Health   Financial Resource Strain: Low Risk  (09/21/2017)   Overall Financial Resource Strain (CARDIA)    Difficulty of Paying Living Expenses: Not hard at all  Food Insecurity: No Food Insecurity (09/21/2017)   Hunger Vital Sign    Worried About Running Out of Food in the Last Year: Never true    Ran Out of Food in the Last Year: Never true  Transportation Needs: No Transportation Needs (09/21/2017)   PRAPARE - Administrator, Civil Service (Medical): No    Lack of Transportation (Non-Medical): No  Physical Activity: Sufficiently Active (09/21/2017)   Exercise Vital Sign    Days of Exercise per Week: 4 days    Minutes of Exercise per Session: 40 min  Stress: No Stress Concern Present (09/21/2017)  [employer] of Occupational Health - Occupational Stress Questionnaire    Feeling of Stress : Not at all  Social Connections: Socially Integrated (09/21/2017)   Social Connection and Isolation Panel [NHANES]    Frequency of Communication with Friends and Family: More than three times a week    Frequency of Social Gatherings with Friends and Family: Three times a week    Attends Religious Services: 1 to 4 times per year    Active Member of Clubs or Organizations: Yes    Attends Banker Meetings: 1 to 4 times per year    Marital Status: Married  Catering manager Violence: Not At Risk (09/21/2017)   Humiliation, Afraid, Rape, and Kick questionnaire    Fear of Current or Ex-Partner: No    Emotionally Abused:  No    Physically Abused: No    Sexually Abused: No    Objective    BP 114/82 (BP Location: Right Arm, Patient Position: Sitting, Cuff Size: Large)   Pulse 64   Ht 6' (1.829 m)   Wt 221 lb 9.6 oz (100.5 kg)   SpO2 100%   BMI 30.05 kg/m   Physical Exam  36 year old male in no acute distress Cardiovascular exam with regular rate and rhythm, no murmur appreciated Lungs clear to auscultation bilaterally In the left axilla, rashes present with erythematous base, increased redness at peripheries of rash, very slight skin peeling noted more so at periphery of rash.  There also appears to be a somewhat pearly appearance centrally to the rash.  No oozing or drainage noted.  Assessment & Plan:   Problem List Items Addressed This Visit       Musculoskeletal and Integument   Rash - Primary    Uncertain etiology.  Did not respond to topical antifungal therapy, which given antifungal utilized, would have expected a response if rash was related to fungal etiology At this time, we will proceed with utilization of topical steroid cream, prescription sent to pharmacy Will also place referral for further evaluation with dermatology      Relevant Medications   triamcinolone cream (KENALOG) 0.5 %   Other Relevant Orders   Ambulatory referral to Dermatology   Other Visit Diagnoses     Wellness examination       Relevant Orders   CBC with Differential/Platelet   Comprehensive metabolic panel   Hemoglobin A1c   Lipid panel   TSH Rfx on Abnormal to Free T4       Return in about 4 weeks (around 04/27/2022) for CPE.   Nicholas Montelongo J De Peru, MD

## 2022-03-30 NOTE — Patient Instructions (Signed)
  Medication Instructions:  Your physician recommends that you continue on your current medications as directed. Please refer to the Current Medication list given to you today. --If you need a refill on any your medications before your next appointment, please call your pharmacy first. If no refills are authorized on file call the office.-- Lab Work: Your physician has recommended that you have lab work today: Yes If you have labs (blood work) drawn today and your tests are completely normal, you will receive your results via MyChart message OR a phone call from our staff.  Please ensure you check your voicemail in the event that you authorized detailed messages to be left on a delegated number. If you have any lab test that is abnormal or we need to change your treatment, we will call you to review the results.  Referrals/Procedures/Imaging: No  Follow-Up: Your next appointment:   Your physician recommends that you schedule a follow-up appointment in: 4-6 weeks with Dr. de [place].  You will receive a text message or e-mail with a link to a survey about your care and experience with us today! We would greatly appreciate your feedback!   Thanks for letting us be apart of your health journey!!  Primary Care and Sports Medicine   Dr. Raymond de Cuba   We encourage you to activate your patient portal called "MyChart".  Sign up information is provided on this After Visit Summary.  MyChart is used to connect with patients for Virtual Visits (Telemedicine).  Patients are able to view lab/test results, encounter notes, upcoming appointments, etc.  Non-urgent messages can be sent to your provider as well. To learn more about what you can do with MyChart, please visit --  [email or web address].    

## 2022-03-31 LAB — COMPREHENSIVE METABOLIC PANEL
ALT: 35 IU/L (ref 0–44)
AST: 28 IU/L (ref 0–40)
Albumin/Globulin Ratio: 1.9 (ref 1.2–2.2)
Albumin: 4.1 g/dL (ref 4.1–5.1)
Alkaline Phosphatase: 65 IU/L (ref 44–121)
BUN/Creatinine Ratio: 9 (ref 9–20)
BUN: 9 mg/dL (ref 6–20)
Bilirubin Total: 1 mg/dL (ref 0.0–1.2)
CO2: 25 mmol/L (ref 20–29)
Calcium: 9.2 mg/dL (ref 8.7–10.2)
Chloride: 104 mmol/L (ref 96–106)
Creatinine, Ser: 1.05 mg/dL (ref 0.76–1.27)
Globulin, Total: 2.2 g/dL (ref 1.5–4.5)
Glucose: 88 mg/dL (ref 70–99)
Potassium: 4.8 mmol/L (ref 3.5–5.2)
Sodium: 139 mmol/L (ref 134–144)
Total Protein: 6.3 g/dL (ref 6.0–8.5)
eGFR: 93 mL/min/{1.73_m2} (ref >59)

## 2022-03-31 LAB — CBC WITH DIFFERENTIAL/PLATELET
Basophils Absolute: 0.1 10*3/uL (ref 0.0–0.2)
Basos: 1 %
EOS (ABSOLUTE): 0.1 10*3/uL (ref 0.0–0.4)
Eos: 2 %
Hematocrit: 43 % (ref 37.5–51.0)
Hemoglobin: 14.6 g/dL (ref 13.0–17.7)
Immature Grans (Abs): 0 10*3/uL (ref 0.0–0.1)
Immature Granulocytes: 0 %
Lymphocytes Absolute: 2 10*3/uL (ref 0.7–3.1)
Lymphs: 37 %
MCH: 30 pg (ref 26.6–33.0)
MCHC: 34 g/dL (ref 31.5–35.7)
MCV: 89 fL (ref 79–97)
Monocytes Absolute: 0.6 10*3/uL (ref 0.1–0.9)
Monocytes: 11 %
Neutrophils Absolute: 2.6 10*3/uL (ref 1.4–7.0)
Neutrophils: 49 %
Platelets: 253 10*3/uL (ref 150–450)
RBC: 4.86 x10E6/uL (ref 4.14–5.80)
RDW: 12.2 % (ref 11.6–15.4)
WBC: 5.3 10*3/uL (ref 3.4–10.8)

## 2022-03-31 LAB — LIPID PANEL
Chol/HDL Ratio: 2.5 ratio (ref 0.0–5.0)
Cholesterol, Total: 134 mg/dL (ref 100–199)
HDL: 54 mg/dL (ref >39)
LDL Chol Calc (NIH): 69 mg/dL (ref 0–99)
Triglycerides: 50 mg/dL (ref 0–149)
VLDL Cholesterol Cal: 11 mg/dL (ref 5–40)

## 2022-03-31 LAB — HEMOGLOBIN A1C
Est. average glucose Bld gHb Est-mCnc: 103 mg/dL
Hgb A1c MFr Bld: 5.2 % (ref 4.8–5.6)

## 2022-03-31 LAB — TSH RFX ON ABNORMAL TO FREE T4: TSH: 1.35 u[IU]/mL (ref 0.450–4.500)

## 2022-04-29 ENCOUNTER — Other Ambulatory Visit (HOSPITAL_COMMUNITY): Payer: Self-pay

## 2022-05-12 ENCOUNTER — Encounter (HOSPITAL_BASED_OUTPATIENT_CLINIC_OR_DEPARTMENT_OTHER): Payer: Self-pay | Admitting: Family Medicine

## 2022-05-12 ENCOUNTER — Ambulatory Visit (INDEPENDENT_AMBULATORY_CARE_PROVIDER_SITE_OTHER): Payer: 59 | Admitting: Family Medicine

## 2022-05-12 ENCOUNTER — Other Ambulatory Visit (HOSPITAL_COMMUNITY): Payer: Self-pay

## 2022-05-12 VITALS — BP 110/80 | HR 68 | Ht 72.0 in | Wt 218.7 lb

## 2022-05-12 DIAGNOSIS — L089 Local infection of the skin and subcutaneous tissue, unspecified: Secondary | ICD-10-CM | POA: Diagnosis not present

## 2022-05-12 DIAGNOSIS — Z0000 Encounter for general adult medical examination without abnormal findings: Secondary | ICD-10-CM | POA: Diagnosis not present

## 2022-05-12 DIAGNOSIS — B958 Unspecified staphylococcus as the cause of diseases classified elsewhere: Secondary | ICD-10-CM | POA: Diagnosis not present

## 2022-05-12 MED ORDER — CLINDAMYCIN PHOSPHATE 1 % EX LOTN
TOPICAL_LOTION | Freq: Two times a day (BID) | CUTANEOUS | 1 refills | Status: DC
  Filled 2022-05-12: qty 60, 20d supply, fill #0
  Filled 2022-06-19 – 2022-06-24 (×2): qty 60, 20d supply, fill #1

## 2022-05-12 NOTE — Assessment & Plan Note (Signed)
Large erythematous macular rash with distinct border left axilla.  Has tried triamcinolone cream without improvement in symptoms.  Will try Cleocin lotion twice daily for presumed bacterial skin infection.  He has a dermatology appointment scheduled for April for further evaluation.

## 2022-05-12 NOTE — Progress Notes (Signed)
Complete physical exam  Patient: Nicholas Nicholson   DOB: 09-27-1985   36 y.o. Male  MRN: [id number]  Subjective:    Chief Complaint  Patient presents with   Annual Exam    Pt here for annual exam     Nicholas Nicholson is a 37 y.o. male who presents today for a complete physical exam. He reports consuming a general diet. Home exercise routine includes calisthenics. He generally feels well. He reports sleeping well. He does have additional problems to discuss today.   Skin infection left axilla, tried steroid cream without improvement. Minimal itching.   Most recent fall risk assessment:    05/12/2022    8:25 AM  Fall Risk   Falls in the past year? 0  Number falls in past yr: 0  Injury with Fall? 0  Risk for fall due to : No Fall Risks  Follow up Falls evaluation completed     Most recent depression screenings:    05/12/2022    8:25 AM 03/30/2022    8:14 AM  PHQ 2/9 Scores  PHQ - 2 Score 0 0  PHQ- 9 Score 0 0  Exception Documentation Medical reason Medical reason        Patient Care Team: de [place], Raymond J, MD as PCP - General (Family Medicine)   Outpatient Medications Prior to Visit  Medication Sig   triamcinolone cream (KENALOG) 0.5 % Apply to affected areas 2 (two) times daily.   [DISCONTINUED] amoxicillin (AMOXIL) 500 MG capsule Take 1 capsule (500 mg total) by mouth 3 (three) times daily for infection   No facility-administered medications prior to visit.    Review of Systems  Constitutional:  Negative for chills and fever.  Respiratory:  Negative for shortness of breath.   Cardiovascular:  Negative for chest pain.  Gastrointestinal:  Negative for abdominal pain, nausea and vomiting.  Skin:  Positive for itching and rash.       Left axilla.   Neurological:  Negative for dizziness and headaches.  Psychiatric/Behavioral:  Negative for depression.           Objective:     BP 110/80 (BP Location: Left Arm, Patient Position: Sitting, Cuff Size:  Large)   Pulse 68   Ht 6' (1.829 m)   Wt 218 lb 11.2 oz (99.2 kg)   SpO2 100%   BMI 29.66 kg/m    Physical Exam Vitals and nursing note reviewed.  Constitutional:      General: He is not in acute distress.    Appearance: Normal appearance. He is normal weight.  HENT:     Right Ear: Tympanic membrane normal.     Left Ear: Tympanic membrane normal.     Nose: Nose normal.     Right Sinus: No maxillary sinus tenderness or frontal sinus tenderness.     Left Sinus: No maxillary sinus tenderness or frontal sinus tenderness.     Mouth/Throat:     Mouth: Mucous membranes are moist.     Pharynx: Oropharynx is clear. Uvula midline.  Eyes:     Extraocular Movements: Extraocular movements intact.  Cardiovascular:     Rate and Rhythm: Normal rate and regular rhythm.     Pulses: Normal pulses.     Heart sounds: Normal heart sounds.  Pulmonary:     Effort: Pulmonary effort is normal.     Breath sounds: Normal breath sounds.  Musculoskeletal:        General: Normal range of motion.  Skin:  General: Skin is warm and dry.     Capillary Refill: Capillary refill takes less than 2 seconds.     Findings: Rash present. Rash is macular.     Comments: Large erythematous macular rash left axilla with marked border.   Neurological:     General: No focal deficit present.     Mental Status: Mental status is at baseline.  Psychiatric:        Mood and Affect: Mood normal.        Behavior: Behavior normal.        Thought Content: Thought content normal.        Judgment: Judgment normal.      No results found for any visits on 05/12/22.     Assessment & Plan:    Routine Health Maintenance and Physical Exam  Immunization History  Administered Date(s) Administered   Influenza-Unspecified 01/19/2019, 02/09/2022   PFIZER(Purple Top)SARS-COV-2 Vaccination 03/21/2019, 05/01/2019    Health Maintenance  Topic Date Due   DTaP/Tdap/Td (1 - Tdap) Never done   COVID-19 Vaccine (3 - 2023-24  season) 12/19/2021   INFLUENZA VACCINE  Completed   Hepatitis C Screening  Completed   HPV VACCINES  Aged Out   HIV Screening  Discontinued    Discussed health benefits of physical activity, and encouraged him to engage in regular exercise appropriate for his age and condition.  Problem List Items Addressed This Visit     Annual physical exam - Primary   Staph skin infection    Large erythematous macular rash with distinct border left axilla.  Has tried triamcinolone cream without improvement in symptoms.  Will try Cleocin lotion twice daily for presumed bacterial skin infection.  He has a dermatology appointment scheduled for April for further evaluation.      Relevant Medications   clindamycin (CLEOCIN-T) 1 % lotion  Routine labs reviewed. HCM reviewed/discussed. Anticipatory guidance regarding healthy weight, lifestyle and choices given. Recommend healthy diet.  Recommend approximately 150 minutes/week of moderate intensity exercise. Limit alcohol consumption: no more than one drink per day for women and 2 drinks per day for me. Recommend regular dental and vision exams. Always use seatbelt/lap and shoulder restraints. Recommend using smoke alarms and checking batteries at least twice a year. Recommend using sunscreen when outside. Discussed colon cancer screening recommendations. Received seasonal influenza vaccine on 02/09/22.    Agrees with plan of care discussed.  Questions answered.  Return in about 1 year (around 05/13/2023) for cpe.     Chalmers Guest, FNP

## 2022-05-12 NOTE — Patient Instructions (Signed)
Apply lotion to left axilla two times per day.

## 2022-05-13 ENCOUNTER — Other Ambulatory Visit (HOSPITAL_COMMUNITY): Payer: Self-pay

## 2022-06-11 ENCOUNTER — Encounter: Payer: Self-pay | Admitting: Family Medicine

## 2022-06-20 ENCOUNTER — Other Ambulatory Visit (HOSPITAL_COMMUNITY): Payer: Self-pay

## 2022-06-24 ENCOUNTER — Other Ambulatory Visit (HOSPITAL_COMMUNITY): Payer: Self-pay

## 2023-05-14 ENCOUNTER — Encounter (HOSPITAL_BASED_OUTPATIENT_CLINIC_OR_DEPARTMENT_OTHER): Payer: 59 | Admitting: Family Medicine

## 2023-06-22 ENCOUNTER — Encounter (HOSPITAL_BASED_OUTPATIENT_CLINIC_OR_DEPARTMENT_OTHER): Payer: 59 | Admitting: Family Medicine

## 2023-07-28 ENCOUNTER — Encounter (HOSPITAL_BASED_OUTPATIENT_CLINIC_OR_DEPARTMENT_OTHER): Admitting: Family Medicine

## 2023-09-28 ENCOUNTER — Ambulatory Visit (HOSPITAL_BASED_OUTPATIENT_CLINIC_OR_DEPARTMENT_OTHER): Admitting: Family Medicine

## 2023-09-28 ENCOUNTER — Encounter (HOSPITAL_BASED_OUTPATIENT_CLINIC_OR_DEPARTMENT_OTHER): Payer: Self-pay | Admitting: Family Medicine

## 2023-09-28 VITALS — BP 103/71 | HR 84 | Ht 72.0 in | Wt 223.8 lb

## 2023-09-28 DIAGNOSIS — Z0000 Encounter for general adult medical examination without abnormal findings: Secondary | ICD-10-CM | POA: Diagnosis not present

## 2023-09-28 DIAGNOSIS — Z23 Encounter for immunization: Secondary | ICD-10-CM | POA: Diagnosis not present

## 2023-09-28 NOTE — Progress Notes (Signed)
 Subjective:    CC: Annual Physical Exam  HPI: Nicholas Nicholson is a 38 y.o. presenting for annual physical  I reviewed the past medical history, family history, social history, surgical history, and allergies today and no changes were needed.  Please see the problem list section below in epic for further details.  Past Medical History: Past Medical History:  Diagnosis Date   History of chickenpox    Past Surgical History: Past Surgical History:  Procedure Laterality Date   FINGER FRACTURE SURGERY     Pinky   Social History: Social History   Socioeconomic History   Marital status: Married    Spouse name: Odilia Bennett    Number of children: 2   Years of education: Not on file   Highest education level: Doctorate  Occupational History   Occupation: Printmaker: [employer]  Tobacco Use   Smoking status: Never    Passive exposure: Never   Smokeless tobacco: Never  Vaping Use   Vaping status: Never Used  Substance and Sexual Activity   Alcohol use: Yes    Comment: socially 1-2 x per week   Drug use: Never   Sexual activity: Yes    Partners: Female    Birth control/protection: None    Comment: with monagamous partner  Other Topics Concern   Not on file  Social History Narrative   Pt was born in [place]. Moved here in 1994. Currently lives with his wife. They have a cat. They are currently trying to have children.    Social Drivers of Corporate investment banker Strain: Low Risk  (09/28/2023)   Overall Financial Resource Strain (CARDIA)    Difficulty of Paying Living Expenses: Not hard at all  Food Insecurity: No Food Insecurity (09/28/2023)   Hunger Vital Sign    Worried About Running Out of Food in the Last Year: Never true    Ran Out of Food in the Last Year: Never true  Transportation Needs: No Transportation Needs (09/28/2023)   PRAPARE - Administrator, Civil Service (Medical): No    Lack of Transportation (Non-Medical): No   Physical Activity: Sufficiently Active (09/28/2023)   Exercise Vital Sign    Days of Exercise per Week: 6 days    Minutes of Exercise per Session: 40 min  Stress: No Stress Concern Present (09/28/2023)   [employer] of Occupational Health - Occupational Stress Questionnaire    Feeling of Stress : Not at all  Social Connections: Moderately Isolated (09/28/2023)   Social Connection and Isolation Panel [NHANES]    Frequency of Communication with Friends and Family: More than three times a week    Frequency of Social Gatherings with Friends and Family: More than three times a week    Attends Religious Services: Never    Database administrator or Organizations: No    Attends Engineer, structural: Never    Marital Status: Married   Family History: Family History  Problem Relation Age of Onset   Hypertension Mother    Breast cancer Mother    Healthy Father    Healthy Sister    Hyperlipidemia Maternal Grandmother    Hypertension Maternal Grandmother    Hypertension Maternal Grandfather    Allergies: Allergies  Allergen Reactions   Tuberculin Tests Rash    To ppd skin test   Medications: See med rec.  Review of Systems: No headache, visual changes, nausea, vomiting, diarrhea, constipation, dizziness, abdominal pain, skin rash, fevers,  chills, night sweats, swollen lymph nodes, weight loss, chest pain, body aches, joint swelling, muscle aches, shortness of breath, mood changes, visual or auditory hallucinations.  Objective:    BP 103/71 (BP Location: Right Arm, Patient Position: Sitting, Cuff Size: Normal)   Pulse 84   Ht 6' (1.829 m)   Wt 223 lb 12.8 oz (101.5 kg)   SpO2 99%   BMI 30.35 kg/m   General: Well Developed, well nourished, and in no acute distress.  Neuro: Alert and oriented x3, extra-ocular muscles intact, sensation grossly intact. Cranial nerves II through XII are intact, motor, sensory, and coordinative functions are all intact. HEENT:  Normocephalic, atraumatic, pupils equal round reactive to light, neck supple, no masses, no lymphadenopathy, thyroid nonpalpable. Oropharynx, nasopharynx, external ear canals are unremarkable. Skin: Warm and dry, no rashes noted.  Cardiac: Regular rate and rhythm, no murmurs rubs or gallops.  Respiratory: Clear to auscultation bilaterally. Not using accessory muscles, speaking in full sentences.  Abdominal: Soft, nontender, nondistended, positive bowel sounds, no masses, no organomegaly.  Musculoskeletal: Shoulder, elbow, wrist, hip, knee, ankle stable, and with full range of motion.  Impression and Recommendations:    Wellness examination Assessment & Plan: Routine HCM labs ordered. HCM reviewed/discussed. Anticipatory guidance regarding healthy weight, lifestyle and choices given. Recommend healthy diet.  Recommend approximately 150 minutes/week of moderate intensity exercise Recommend regular dental and vision exams Always use seatbelt/lap and shoulder restraints Recommend using smoke alarms and checking batteries at least twice a year Recommend using sunscreen when outside Discussed immunization recommendations  Orders: -     CBC with Differential/Platelet -     Comprehensive metabolic panel with GFR -     Hemoglobin A1c -     Lipid panel -     TSH Rfx on Abnormal to Free T4  Return in about 1 year (around 09/27/2024) for CPE.   ___________________________________________ Matteus Mcnelly de [place], MD, ABFM, CAQSM Primary Care and Sports Medicine [facility]

## 2023-09-28 NOTE — Assessment & Plan Note (Signed)

## 2023-09-28 NOTE — Patient Instructions (Signed)
   Medication Instructions:  Your physician recommends that you continue on your current medications as directed. Please refer to the Current Medication list given to you today. --If you need a refill on any your medications before your next appointment, please call your pharmacy first. If no refills are authorized on file call the office.-- Lab Work: Your physician has recommended that you have lab work today: today If you have labs (blood work) drawn today and your tests are completely normal, you will receive your results via MyChart message OR a phone call from our staff.  Please ensure you check your voicemail in the event that you authorized detailed messages to be left on a delegated number. If you have any lab test that is abnormal or we need to change your treatment, we will call you to review the results.    Follow-Up: Your next appointment:   Your physician recommends that you schedule a follow-up appointment in: 1 year physical with Dr. de [place]  You will receive a text message or e-mail with a link to a survey about your care and experience with [place] today! We would greatly appreciate your feedback!   Thanks for letting us be apart of your health journey!!  Primary Care and Sports Medicine   Dr. Ceasar Mons Peru   We encourage you to activate your patient portal called "MyChart".  Sign up information is provided on this After Visit Summary.  MyChart is used to connect with patients for Virtual Visits (Telemedicine).  Patients are able to view lab/test results, encounter notes, upcoming appointments, etc.  Non-urgent messages can be sent to your provider as well. To learn more about what you can do with MyChart, please visit --  [email or web address].

## 2023-09-29 LAB — COMPREHENSIVE METABOLIC PANEL WITH GFR
ALT: 86 IU/L — ABNORMAL HIGH (ref 0–44)
AST: 56 IU/L — ABNORMAL HIGH (ref 0–40)
Albumin: 4.2 g/dL (ref 4.1–5.1)
Alkaline Phosphatase: 87 IU/L (ref 44–121)
BUN/Creatinine Ratio: 13 (ref 9–20)
BUN: 15 mg/dL (ref 6–24)
Bilirubin Total: 0.9 mg/dL (ref 0.0–1.2)
CO2: 21 mmol/L (ref 20–29)
Calcium: 9.4 mg/dL (ref 8.7–10.2)
Chloride: 104 mmol/L (ref 96–106)
Creatinine, Ser: 1.14 mg/dL (ref 0.76–1.27)
Globulin, Total: 2.6 g/dL (ref 1.5–4.5)
Glucose: 66 mg/dL — ABNORMAL LOW (ref 70–99)
Potassium: 4.6 mmol/L (ref 3.5–5.2)
Sodium: 141 mmol/L (ref 134–144)
Total Protein: 6.8 g/dL (ref 6.0–8.5)
eGFR: 83 mL/min/{1.73_m2} (ref >59)

## 2023-09-29 LAB — CBC WITH DIFFERENTIAL/PLATELET
Basophils Absolute: 0.1 10*3/uL (ref 0.0–0.2)
Basos: 1 %
EOS (ABSOLUTE): 0.1 10*3/uL (ref 0.0–0.4)
Eos: 2 %
Hematocrit: 44.9 % (ref 37.5–51.0)
Hemoglobin: 14.8 g/dL (ref 13.0–17.7)
Immature Grans (Abs): 0 10*3/uL (ref 0.0–0.1)
Immature Granulocytes: 0 %
Lymphocytes Absolute: 2.3 10*3/uL (ref 0.7–3.1)
Lymphs: 35 %
MCH: 30.3 pg (ref 26.6–33.0)
MCHC: 33 g/dL (ref 31.5–35.7)
MCV: 92 fL (ref 79–97)
Monocytes Absolute: 0.6 10*3/uL (ref 0.1–0.9)
Monocytes: 10 %
Neutrophils Absolute: 3.4 10*3/uL (ref 1.4–7.0)
Neutrophils: 52 %
Platelets: 281 10*3/uL (ref 150–450)
RBC: 4.88 x10E6/uL (ref 4.14–5.80)
RDW: 13 % (ref 11.6–15.4)
WBC: 6.5 10*3/uL (ref 3.4–10.8)

## 2023-09-29 LAB — LIPID PANEL
Chol/HDL Ratio: 2.9 ratio (ref 0.0–5.0)
Cholesterol, Total: 171 mg/dL (ref 100–199)
HDL: 60 mg/dL (ref >39)
LDL Chol Calc (NIH): 99 mg/dL (ref 0–99)
Triglycerides: 61 mg/dL (ref 0–149)
VLDL Cholesterol Cal: 12 mg/dL (ref 5–40)

## 2023-09-29 LAB — HEMOGLOBIN A1C
Est. average glucose Bld gHb Est-mCnc: 103 mg/dL
Hgb A1c MFr Bld: 5.2 % (ref 4.8–5.6)

## 2023-09-29 LAB — TSH RFX ON ABNORMAL TO FREE T4: TSH: 1.45 u[IU]/mL (ref 0.450–4.500)

## 2023-10-05 ENCOUNTER — Ambulatory Visit (HOSPITAL_BASED_OUTPATIENT_CLINIC_OR_DEPARTMENT_OTHER): Payer: Self-pay | Admitting: Family Medicine

## 2024-02-07 ENCOUNTER — Other Ambulatory Visit (HOSPITAL_BASED_OUTPATIENT_CLINIC_OR_DEPARTMENT_OTHER)

## 2024-03-01 ENCOUNTER — Telehealth: Admitting: Physician Assistant

## 2024-03-01 ENCOUNTER — Other Ambulatory Visit (HOSPITAL_COMMUNITY): Payer: Self-pay

## 2024-03-01 DIAGNOSIS — B958 Unspecified staphylococcus as the cause of diseases classified elsewhere: Secondary | ICD-10-CM | POA: Diagnosis not present

## 2024-03-01 DIAGNOSIS — L089 Local infection of the skin and subcutaneous tissue, unspecified: Secondary | ICD-10-CM

## 2024-03-01 MED ORDER — CLINDAMYCIN PHOS (TWICE-DAILY) 1 % EX GEL
Freq: Two times a day (BID) | CUTANEOUS | 0 refills | Status: AC
  Filled 2024-03-01: qty 30, 30d supply, fill #0

## 2024-03-01 NOTE — Progress Notes (Signed)
 E Visit for Rash  We are sorry that you are not feeling well. Here is how we plan to help!  I see you have a history of this issue in your chart, initially thought to be fungal in nature but not responsive to treatment for this. The current rash does look like yeast to me, but giving your history of same issue with response to treatment for bacterial infection, I will send in Clindamycin  topical to apply to the area. However, if not quickly resolving, I want you to be re-evaluated in person.    HOME CARE:  Take cool showers and avoid direct sunlight. Apply cool compress or wet dressings. Take a bath in an oatmeal bath.  Sprinkle content of one Aveeno packet under running faucet with comfortably warm water.  Bathe for 15-20 minutes, 1-2 times daily.  Pat dry with a towel. Do not rub the rash. Use hydrocortisone cream. Take an antihistamine like Benadryl for widespread rashes that itch.  The adult dose of Benadryl is 25-50 mg by mouth 4 times daily. Caution:  This type of medication may cause sleepiness.  Do not drink alcohol, drive, or operate dangerous machinery while taking antihistamines.  Do not take these medications if you have prostate enlargement.  Read package instructions thoroughly on all medications that you take.  GET HELP RIGHT AWAY IF:  Symptoms don't go away after treatment. Severe itching that persists. If you rash spreads or swells. If you rash begins to smell. If it blisters and opens or develops a yellow-brown crust. You develop a fever. You have a sore throat. You become short of breath.  MAKE SURE YOU:  Understand these instructions. Will watch your condition. Will get help right away if you are not doing well or get worse.  Thank you for choosing an e-visit. Your e-visit answers were reviewed by a board certified advanced clinical practitioner to complete your personal care plan. Depending upon the condition, your plan could have included both over the counter  or prescription medications. Please review your pharmacy choice. Be sure that the pharmacy you have chosen is open so that you can pick up your prescription now.  If there is a problem you may message your provider in MyChart to have the prescription routed to another pharmacy. Your safety is important to us . If you have drug allergies check your prescription carefully.  For the next 24 hours, you can use MyChart to ask questions about today's visit, request a non-urgent call back, or ask for a work or school excuse from your e-visit provider. You will get an email in the next two days asking about your experience. I hope that your e-visit has been valuable and will speed your recovery.  I have spent 5 minutes in review of e-visit questionnaire, review and updating patient chart, medical decision making and response to patient.   Elsie Velma Lunger, PA-C

## 2024-09-29 ENCOUNTER — Encounter (HOSPITAL_BASED_OUTPATIENT_CLINIC_OR_DEPARTMENT_OTHER): Admitting: Family Medicine
